# Patient Record
Sex: Male | Born: 1980 | Race: White | Hispanic: No | Marital: Single | Smoking: Former smoker
Health system: Southern US, Community
[De-identification: ages and names within clinical notes are randomized; demographics above are authoritative.]

## PROBLEM LIST (undated history)

## (undated) ENCOUNTER — Emergency Department: Discharge status: Absent without leave | Payer: Self-pay | Source: Home / Self Care

## (undated) DIAGNOSIS — M545 Low back pain: Secondary | ICD-10-CM

## (undated) DIAGNOSIS — M722 Plantar fascial fibromatosis: Secondary | ICD-10-CM

## (undated) HISTORY — DX: Low back pain: M54.5

## (undated) HISTORY — DX: Plantar fascial fibromatosis: M72.2

---

## 2009-10-08 ENCOUNTER — Ambulatory Visit: Payer: Self-pay | Admitting: Emergency Medicine

## 2010-02-27 NOTE — Assessment & Plan Note (Signed)
Summary: LBP x 2 dys rm 4   Vital Signs:  Patient Profile:   30 Years Old Male CC:      Back pain x 2dys Height:     73.5 inches Weight:      216 pounds O2 Sat:      100 % O2 treatment:    Room Air Temp:     98.6 degrees F oral Pulse rate:   66 / minute Pulse rhythm:   regular Resp:     16 per minute BP sitting:   132 / 75  (left arm) Cuff size:   regular  Vitals Entered By: Areta Haber CMA (October 08, 2009 4:28 PM)                  Current Allergies (reviewed today): ! AMOXICILLIN ! PENICILLIN      History of Present Illness History from: patient Chief Complaint: Back pain x 2dys History of Present Illness: Severe back pain for 2 days.  H/o herniated disc (?L3/4) about 2 years ago tx w/ dose pack, then injections, and time.  This time was going down a slide with a child and stood up and felt the pain.  Stiffness, pain, mild radiation to B legs.  No bladder/bowel dysfx or saddle anesthesia.  No dysuria, hematuria.  Pain is sharp and cramping.  Current Problems: LOWER BACK PAIN (ICD-724.2)   Current Meds IBUPROFEN 200 MG TABS (IBUPROFEN) as directed FLEXERIL 10 MG TABS (CYCLOBENZAPRINE HCL) 1 tab by mouth three times a day as needed for spasms ULTRACET 37.5-325 MG TABS (TRAMADOL-ACETAMINOPHEN) 1 tab by mouth Q6 hours as needed for pain PREDNISONE (PAK) 10 MG TABS (PREDNISONE) use as directed  REVIEW OF SYSTEMS Constitutional Symptoms      Denies fever, chills, night sweats, weight loss, weight gain, and fatigue.  Eyes       Denies change in vision, eye pain, eye discharge, glasses, contact lenses, and eye surgery. Ear/Nose/Throat/Mouth       Denies hearing loss/aids, change in hearing, ear pain, ear discharge, dizziness, frequent runny nose, frequent nose bleeds, sinus problems, sore throat, hoarseness, and tooth pain or bleeding.  Respiratory       Denies dry cough, productive cough, wheezing, shortness of breath, asthma, bronchitis, and emphysema/COPD.   Cardiovascular       Denies murmurs, chest pain, and tires easily with exhertion.    Gastrointestinal       Denies stomach pain, nausea/vomiting, diarrhea, constipation, blood in bowel movements, and indigestion. Genitourniary       Denies painful urination, kidney stones, and loss of urinary control. Neurological       Denies paralysis, seizures, and fainting/blackouts. Musculoskeletal       Complains of muscle pain, joint pain, and decreased range of motion.      Denies joint stiffness, redness, swelling, muscle weakness, and gout.      Comments: LBP x 2 dys Skin       Denies bruising, unusual mles/lumps or sores, and hair/skin or nail changes.  Psych       Denies mood changes, temper/anger issues, anxiety/stress, speech problems, depression, and sleep problems.  Past History:  Past Medical History: Herniated Disc   Social History: Single Never Smoked Alcohol use-no Drug use-no Regular exercise-no Smoking Status:  never Drug Use:  no Does Patient Exercise:  no Physical Exam General appearance: well developed, well nourished, mild distress Head: normocephalic, atraumatic Chest/Lungs: no rales, wheezes, or rhonchi bilateral, breath sounds equal without effort Heart: regular  rate and  rhythm, no murmur Extremities: FROM, full strength and normal sensation Neurological: grossly intact and non-focal Back: No TTP.  Lumbar kyphosis.  SLR+ bilateral.  Skin: no obvious rashes or lesions MSE: oriented to time, place, and person Assessment New Problems: LOWER BACK PAIN (ICD-724.2)   Patient Education: Patient and/or caregiver instructed in the following: rest, fluids. heating pad  Plan New Medications/Changes: PREDNISONE (PAK) 10 MG TABS (PREDNISONE) use as directed  #QS x 0, 10/08/2009, Hoyt Koch MD ULTRACET 37.5-325 MG TABS (TRAMADOL-ACETAMINOPHEN) 1 tab by mouth Q6 hours as needed for pain  #30 x 0, 10/08/2009, Hoyt Koch MD FLEXERIL 10 MG TABS  (CYCLOBENZAPRINE HCL) 1 tab by mouth three times a day as needed for spasms  #21 x 0, 10/08/2009, Hoyt Koch MD  New Orders: New Patient Level II [phone] Planning Comments:   If not improving in about 3 days, call the office and we will refer to sports medicine clinic.  You may need to have a follow up MRI or other imaging due to your history of previous herniated disc.    The patient and/or caregiver has been counseled thoroughly with regard to medications prescribed including dosage, schedule, interactions, rationale for use, and possible side effects and they verbalize understanding.  Diagnoses and expected course of recovery discussed and will return if not improved as expected or if the condition worsens. Patient and/or caregiver verbalized understanding.  Prescriptions: PREDNISONE (PAK) 10 MG TABS (PREDNISONE) use as directed  #QS x 0   Entered and Authorized by:   Hoyt Koch MD   Signed by:   Hoyt Koch MD on 10/08/2009   Method used:   Print then Give to Patient   RxID:   [id number] ULTRACET 37.5-325 MG TABS (TRAMADOL-ACETAMINOPHEN) 1 tab by mouth Q6 hours as needed for pain  #30 x 0   Entered and Authorized by:   Hoyt Koch MD   Signed by:   Hoyt Koch MD on 10/08/2009   Method used:   Print then Give to Patient   RxID:   [id number] FLEXERIL 10 MG TABS (CYCLOBENZAPRINE HCL) 1 tab by mouth three times a day as needed for spasms  #21 x 0   Entered and Authorized by:   Hoyt Koch MD   Signed by:   Hoyt Koch MD on 10/08/2009   Method used:   Print then Give to Patient   RxID:   [id number]   Orders Added: 1)  New Patient Level II [40347]  Appended Document: LBP x 2 dys rm 4 F/U call to pt - per recording, number provided is incorrect or disconnected.

## 2012-01-25 ENCOUNTER — Emergency Department
Admission: EM | Admit: 2012-01-25 | Discharge: 2012-01-25 | Disposition: A | Discharge status: Home / Self Care | Payer: Self-pay | Source: Home / Self Care | Attending: Family Medicine | Admitting: Family Medicine

## 2012-01-25 DIAGNOSIS — M545 Low back pain: Secondary | ICD-10-CM

## 2012-01-25 MED ORDER — HYDROCODONE-ACETAMINOPHEN 5-325 MG PO TABS: ORAL_TABLET | ORAL | Status: DC

## 2012-01-25 MED ORDER — METHYLPREDNISOLONE SODIUM SUCC 125 MG IJ SOLR
125.0000 mg | Freq: Once | INTRAMUSCULAR | Status: AC
  Administered 2012-01-25: 125 mg via INTRAMUSCULAR

## 2012-01-25 MED ORDER — PREDNISONE 10 MG PO TABS: ORAL_TABLET | ORAL | Status: DC

## 2012-01-25 MED ORDER — KETOROLAC TROMETHAMINE 60 MG/2ML IM SOLN
60.0000 mg | Freq: Once | INTRAMUSCULAR | Status: AC
  Administered 2012-01-25: 60 mg via INTRAMUSCULAR

## 2012-01-25 NOTE — ED Notes (Signed)
Herniated disc L4/S2, states walking shopping on Thursday and noted with increase back pain on Friday.

## 2012-01-25 NOTE — ED Provider Notes (Signed)
History     CSN: [id number]  Arrival date & time 01/25/12  1728   First MD Initiated Contact with Patient 01/25/12 1834      Chief Complaint  Patient presents with  . Back Pain     HPI Comments: Patient states that he had been shopping 2 days ago.  Yesterday he awoke with recurrent low back pain that occasionally radiates to posterior legs.  The pain is worse when walking and standing.  No bowel or bladder dysfunction.  No saddle numbness.  He states that he has a past history of herniated disc  Patient is a 31 y.o. male presenting with back pain. The history is provided by the patient.  Back Pain  This is a recurrent problem. The current episode started yesterday. The problem occurs constantly. The problem has not changed since onset.Associated with: walking. The pain is present in the lumbar spine and gluteal region. The quality of the pain is described as stabbing. The pain radiates to the left thigh and right thigh. The pain is moderate. Exacerbated by: walking and standing. The pain is the same all the time. Associated symptoms include paresis. Pertinent negatives include no chest pain, no fever, no numbness, no weight loss, no headaches, no abdominal pain, no abdominal swelling, no bowel incontinence, no perianal numbness, no bladder incontinence, no dysuria, no pelvic pain, no leg pain, no paresthesias, no tingling and no weakness.    History reviewed. No pertinent past medical history.  History reviewed. No pertinent past surgical history.  Family History  Problem Relation Age of Onset  . Hypertension Mother   . Diabetes Mother   . Hypertension Father   . Diabetes Father     History  Substance Use Topics  . Smoking status: Not on file  . Smokeless tobacco: Not on file  . Alcohol Use:       Review of Systems  Constitutional: Negative for fever and weight loss.  Cardiovascular: Negative for chest pain.  Gastrointestinal: Negative for abdominal pain and bowel  incontinence.  Genitourinary: Negative for bladder incontinence, dysuria and pelvic pain.  Musculoskeletal: Positive for back pain.  Neurological: Negative for tingling, weakness, numbness, headaches and paresthesias.  All other systems reviewed and are negative.    Allergies  Amoxicillin and Penicillins  Home Medications   Current Outpatient Rx  Name  Route  Sig  Dispense  Refill  . HYDROCODONE-ACETAMINOPHEN 5-325 MG PO TABS      Take one by mouth at bedtime as needed for pain   10 tablet   0   . PREDNISONE 10 MG PO TABS      Take 2 tabs by mouth twice daily for three days, then one tab twice daily for 2 days, then 1 tab daily for two days.  Take PC.  Begin Sunday 01/26/12   18 tablet   0     BP 126/80  Pulse 104  Temp 98.2 F (36.8 C) (Oral)  Resp 20  Ht 6' 1.5" (1.867 m)  Wt 229 lb (103.874 kg)  BMI 29.80 kg/m2  SpO2 96%  Physical Exam Nursing notes and Vital Signs reviewed. Appearance:  Patient appears healthy, stated age, and in no acute distress but moves slowly and deliberately onto exam table Eyes:  Pupils are equal, round, and reactive to light and accomodation.  Extraocular movement is intact.  Conjunctivae are not inflamed  Pharynx:  Normal Neck:  Supple.  No adenopathy  Lungs:  Clear to auscultation.  Breath sounds are equal.  Heart:  Regular rate and rhythm without murmurs, rubs, or gallops.  Abdomen:  Nontender without masses or hepatosplenomegaly.  Bowel sounds are present.  No CVA or flank tenderness.  Extremities:  No edema.  No calf tenderness Skin:  No rash present.  Back:  Decreased range of motion.  Tenderness in the midline and bilateral paraspinous muscles from L4 to Sacral area.  Unable to perform straight leg raise testing (patient has difficulty achieving supine position).  Sitting knee extension test is negative.  Strength and sensation in the lower extremities is normal.  Patellar and achilles reflexes are normal   ED Course  Procedures   none      1. Acute low back pain       MDM  Toradol 60mg  IM Solumedrol 125mg .  Tomorrow begin prednisone taper.  Lortab for pain at night. Apply ice pack to lower back two or three times daily. Followup with orthopedist in one week.        Lattie Haw, MD 01/28/12 2013

## 2012-02-07 ENCOUNTER — Ambulatory Visit (INDEPENDENT_AMBULATORY_CARE_PROVIDER_SITE_OTHER): Payer: Self-pay

## 2012-02-07 ENCOUNTER — Ambulatory Visit (INDEPENDENT_AMBULATORY_CARE_PROVIDER_SITE_OTHER): Payer: Self-pay | Admitting: Sports Medicine

## 2012-02-07 VITALS — BP 116/83 | HR 112 | Wt 227.0 lb

## 2012-02-07 DIAGNOSIS — M47816 Spondylosis without myelopathy or radiculopathy, lumbar region: Secondary | ICD-10-CM | POA: Insufficient documentation

## 2012-02-07 DIAGNOSIS — M545 Low back pain, unspecified: Secondary | ICD-10-CM

## 2012-02-07 DIAGNOSIS — M5137 Other intervertebral disc degeneration, lumbosacral region: Secondary | ICD-10-CM

## 2012-02-07 HISTORY — DX: Low back pain, unspecified: M54.50

## 2012-02-07 MED ORDER — GABAPENTIN 300 MG PO CAPS: ORAL_CAPSULE | ORAL | Status: DC

## 2012-02-07 MED ORDER — MELOXICAM 15 MG PO TABS: ORAL_TABLET | ORAL | Status: DC

## 2012-02-07 NOTE — Progress Notes (Signed)
SPORTS MEDICINE CONSULTATION REPORT  Subjective:    Primary care provider: [facility].  CC: Low back pain  HPI: Jason Brandt is a very pleasant 32 year old male who comes in with an unfortunate history of pain he localizes in the midline of his lower lumbar spine for several decades. The pain is been on and off. He recalls having injections done under fluoroscopy in the distant past, for what sounds like degenerative disc disease, these were effective, the pain he has now is different. It is localized but does radiate down into his right buttock, is worse when getting up from sitting, and worse with extension. Is not worse with Valsalva. He describes it as if a spear was stabbed into his back. He denies any constitutional symptoms, and denies any bowel or bladder dysfunction.  Past medical history, Surgical history, Family history, Social history, Allergies, and medications have been entered into the medical record, reviewed, and no changes needed.   Review of Systems: No headache, visual changes, nausea, vomiting, diarrhea, constipation, dizziness, abdominal pain, skin rash, fevers, chills, night sweats, weight loss, swollen lymph nodes, body aches, joint swelling, muscle aches, chest pain, shortness of breath, mood changes, visual or auditory hallucinations.   Objective:   Vitals:  Afebrile, vital signs stable. General: Well Developed, well nourished, and in no acute distress.  Neuro/Psych: Alert and oriented x3, extra-ocular muscles intact, able to move all 4 extremities, sensation grossly intact. Skin: Warm and dry, no rashes noted.  Respiratory: Not using accessory muscles, speaking in full sentences, trachea midline.  Cardiovascular: Pulses palpable, no extremity edema. Abdomen: Does not appear distended. Back Exam:  Inspection: Unremarkable  Motion: Flexion 45 deg, Extension 45 deg, Side Bending to 45 deg bilaterally,  Rotation to 45 deg bilaterally  SLR laying: Reproduces pain in the  back but not radicular pain.  XSLR laying: Negative  Palpable tenderness: None. FABER: negative. Sensory change: Gross sensation intact to all lumbar and sacral dermatomes.  Reflexes: 2+ at both patellar tendons, 2+ at achilles tendons, Babinski's downgoing.  Strength at foot  Plantar-flexion: 5/5 Dorsi-flexion: 5/5 Eversion: 5/5 Inversion: 5/5  Leg strength  Quad: 5/5 Hamstring: 5/5 Hip flexor: 5/5 Hip abductors: 5/5  Gait unremarkable.  Impression and Recommendations:   This case required medical decision making of moderate complexity.

## 2012-02-07 NOTE — Assessment & Plan Note (Signed)
Axial with radiation into right buttock. Symptoms are worse with extension which suggests facet arthrosis. He has had epidural injections in the past but this pain is different. I would like to obtain an x-ray, as well as an MRI of his lumbar spine for interventional injection planning. I'm also going to start Mobic and gabapentin and have him do some home rehabilitation exercises. I will see him back for results of the MRI. Of note, he would like the MRI scheduled after February 1.

## 2012-02-07 NOTE — Patient Instructions (Signed)
SPORTS MEDICINE CONSULTATION REPORT  Subjective:    Primary care physician: [facility].  CC: Back pain.  HPI: Jason Brandt is a very pleasant 32 year old male who comes in with a decade history of on and off pain he localizes in the right side of his lower lumbar spine. He recalls injuring his back doing a power lift as a kid, since then his pain has been localized, but does radiate down into the right buttock. It is worse when getting up from sitting, as well as with extension. He denies any worsening of pain with Valsalva, denies any constitutional symptoms, and denies any bowel or bladder dysfunction. He has been seen in the past, has had MRIs that show what sound to be L4-L5 degenerative disc disease, and it sounds as though he's had lumbar epidural injections which were only moderately effective. He notes that his pain today is significantly different and he describes it as if a spear was stabbed into his back.  Past medical history, Surgical history, Family history, Social history, Allergies, and medications have been entered into the medical record, reviewed, and no changes needed.   Review of Systems: No headache, visual changes, nausea, vomiting, diarrhea, constipation, dizziness, abdominal pain, skin rash, fevers, chills, night sweats, weight loss, swollen lymph nodes, body aches, joint swelling, muscle aches, chest pain, shortness of breath, mood changes, visual or auditory hallucinations.   Objective:   Vitals:  Afebrile, vital signs stable. General: Well Developed, well nourished, and in no acute distress.  Neuro/Psych: Alert and oriented x3, extra-ocular muscles intact, able to move all 4 extremities, sensation grossly intact. Skin: Warm and dry, no rashes noted.  Respiratory: Not using accessory muscles, speaking in full sentences, trachea midline.  Cardiovascular: Pulses palpable, no extremity edema. Abdomen: Does not appear distended. Back Exam:  Inspection: Unremarkable    Motion: Flexion 45 deg, Extension 45 deg, Side Bending to 45 deg bilaterally,  Rotation to 45 deg bilaterally  SLR laying: Positive with reproduction of pain in back, but no radicular symptoms.  XSLR laying: Negative  Palpable tenderness: None. FABER: negative. Sensory change: Gross sensation intact to all lumbar and sacral dermatomes.  Reflexes: 2+ at both patellar tendons, 2+ at achilles tendons, Babinski's downgoing.  Strength at foot  Plantar-flexion: 5/5 Dorsi-flexion: 5/5 Eversion: 5/5 Inversion: 5/5  Leg strength  Quad: 5/5 Hamstring: 5/5 Hip flexor: 5/5 Hip abductors: 5/5  Gait unremarkable.  Impression and Recommendations:   This case required medical decision making of moderate complexity.

## 2012-02-29 ENCOUNTER — Ambulatory Visit (HOSPITAL_BASED_OUTPATIENT_CLINIC_OR_DEPARTMENT_OTHER): Payer: Self-pay

## 2012-03-05 ENCOUNTER — Telehealth: Payer: Self-pay | Admitting: *Deleted

## 2012-03-05 NOTE — Telephone Encounter (Signed)
Prior auth obtained for MRI Lumbar spine w/o contrast. Auth # [id number]

## 2012-03-06 ENCOUNTER — Ambulatory Visit (INDEPENDENT_AMBULATORY_CARE_PROVIDER_SITE_OTHER): Payer: BC Managed Care – PPO | Admitting: Family Medicine

## 2012-03-06 ENCOUNTER — Encounter: Payer: Self-pay | Admitting: Family Medicine

## 2012-03-06 VITALS — BP 128/88 | HR 77 | Ht 73.5 in | Wt 229.0 lb

## 2012-03-06 DIAGNOSIS — M722 Plantar fascial fibromatosis: Secondary | ICD-10-CM | POA: Insufficient documentation

## 2012-03-06 DIAGNOSIS — B354 Tinea corporis: Secondary | ICD-10-CM

## 2012-03-06 DIAGNOSIS — L739 Follicular disorder, unspecified: Secondary | ICD-10-CM | POA: Insufficient documentation

## 2012-03-06 DIAGNOSIS — M545 Low back pain, unspecified: Secondary | ICD-10-CM

## 2012-03-06 DIAGNOSIS — L738 Other specified follicular disorders: Secondary | ICD-10-CM

## 2012-03-06 HISTORY — DX: Plantar fascial fibromatosis: M72.2

## 2012-03-06 MED ORDER — MELOXICAM 15 MG PO TABS: ORAL_TABLET | ORAL | Status: DC

## 2012-03-06 MED ORDER — CLOTRIMAZOLE 1 % EX CREA: TOPICAL_CREAM | CUTANEOUS | Status: AC

## 2012-03-06 MED ORDER — SULFAMETHOXAZOLE-TRIMETHOPRIM 800-160 MG PO TABS: ORAL_TABLET | ORAL | Status: AC

## 2012-03-06 NOTE — Progress Notes (Signed)
CC: Jason Brandt is a 32 y.o. male is here for Establish Care   Subjective: HPI:  Patient presents to establish care  Acute complaint of rash on the back. It has been present for 2-3 weeks. Seems more apparent this last week. Worse the more he lies down on his back, improves with less time spent lying on back. Associated with itching which is described as moderate in severity but no other pain. He's never had this before. It is present all hours of the day. No interventions as of yet. It is localized only on his back without any skin changes elsewhere. Nothing other than above makes it better or worse.   Complains of chronic back pain which is currently being managed by Dr. Karie Schwalbe. He is about to run out of meloxicam and is requesting refill. He describes his back pain is severe but meloxicam has brought up to moderate and tolerable levels. Describes this as a soreness just above his right buttock that radiates down to the right knee. Worse when standing up quickly, sitting for more than 15 minutes, or lying down for more than 3 hours. Character and severity of pain has not changed since she saw Dr. Karie Schwalbe. last. He has a MRI scheduled for tomorrow. Denies weakness of the extremities, saddle paresthesia, bowel or bladder incontinence.   Review of Systems - General ROS: negative for - chills, fever, night sweats, weight gain or weight loss Ophthalmic ROS: negative for - decreased vision Psychological ROS: negative for - anxiety . Positive for subjective depression ENT ROS: negative for - hearing change, nasal congestion, tinnitus or allergies Hematological and Lymphatic ROS: negative for - bleeding problems, bruising or swollen lymph nodes Breast ROS: negative Respiratory ROS: no cough, shortness of breath, or wheezing Cardiovascular ROS: no chest pain or dyspnea on exertion Gastrointestinal ROS: no abdominal pain, change in bowel habits, or black or bloody stools Genito-Urinary ROS: negative for -  genital discharge, genital ulcers, incontinence or abnormal bleeding from genitals Neurological ROS: negative for - headaches or memory loss Dermatological ROS: negative for lumps, mole changes, rash and skin lesion changes other than that described above  Past Medical History  Diagnosis Date  . Low back pain 02/07/2012  . Plantar fasciitis 03/06/2012     Family History  Problem Relation Age of Onset  . Hypertension Mother   . Diabetes Mother   . Hypertension Father   . Diabetes Father      History  Substance Use Topics  . Smoking status: Former Games developer  . Smokeless tobacco: Not on file  . Alcohol Use: Not on file     Objective: Filed Vitals:   03/06/12 1105  BP: 128/88  Pulse: 77    General: Alert and Oriented, No Acute Distress HEENT: Pupils equal, round, reactive to light. Conjunctivae clear.  External ears unremarkable, moist mucous membranes Lungs: Clear comfortable work of breathing Cardiac: Regular rate and rhythm. Abdomen: Soft and nontender Extremities: No peripheral edema.  Strong peripheral pulses.  Mental Status: No depression, anxiety, nor agitation. Skin: Warm and dry. There is a quarter-sized circular lesion on the left flank with central clearing in the periphery of hyperkeratotic skin with erythema. There are diffuse pustules scattered around his back  Assessment & Plan: Ray[place] was seen today for establish care.  Diagnoses and associated orders for this visit:  Folliculitis - sulfamethoxazole-trimethoprim (SEPTRA DS) 800-160 MG per tablet; One by mouth twice a day for ten days.  Ringworm of body - clotrimazole (LOTRIMIN) 1 %  cream; Apply to affected areas twice a day for up to four weeks, applying up to two weeks after resolution of symptoms.  Low back pain - meloxicam (MOBIC) 15 MG tablet; One tab PO qAM with breakfast for 2 weeks, then daily prn pain.    Folliculitis: Penicillin allergy therefore start Septra, discussed keeping back clean and dry  to avoid reoccurrence. Ringworm: Start clotrimazole Low back pain: Refill meloxicam, keep followup with Dr. Karie Schwalbe. following tomorrow's MRI.  30 minutes spent face-to-face during visit today of which at least 50% was counseling or coordinating care regarding low back pain, ringworm, folliculitis.   Return in about 4 weeks (around 04/03/2012).

## 2012-03-07 ENCOUNTER — Ambulatory Visit (HOSPITAL_BASED_OUTPATIENT_CLINIC_OR_DEPARTMENT_OTHER)
Admission: RE | Admit: 2012-03-07 | Discharge: 2012-03-07 | Disposition: A | Discharge status: Home / Self Care | Payer: BC Managed Care – PPO | Source: Ambulatory Visit | Attending: Sports Medicine | Admitting: Sports Medicine

## 2012-03-07 DIAGNOSIS — M5137 Other intervertebral disc degeneration, lumbosacral region: Secondary | ICD-10-CM | POA: Insufficient documentation

## 2012-03-07 DIAGNOSIS — M51379 Other intervertebral disc degeneration, lumbosacral region without mention of lumbar back pain or lower extremity pain: Secondary | ICD-10-CM | POA: Insufficient documentation

## 2012-03-07 DIAGNOSIS — M5126 Other intervertebral disc displacement, lumbar region: Secondary | ICD-10-CM | POA: Insufficient documentation

## 2012-03-07 DIAGNOSIS — M545 Low back pain, unspecified: Secondary | ICD-10-CM | POA: Insufficient documentation

## 2012-03-10 ENCOUNTER — Other Ambulatory Visit: Payer: Self-pay

## 2012-03-10 ENCOUNTER — Ambulatory Visit (INDEPENDENT_AMBULATORY_CARE_PROVIDER_SITE_OTHER): Payer: BC Managed Care – PPO | Admitting: Sports Medicine

## 2012-03-10 DIAGNOSIS — M545 Low back pain, unspecified: Secondary | ICD-10-CM

## 2012-03-10 MED ORDER — HYDROCODONE-ACETAMINOPHEN 10-325 MG PO TABS: 1.0000 | ORAL_TABLET | Freq: Three times a day (TID) | ORAL | Status: DC | PRN

## 2012-03-10 NOTE — Progress Notes (Signed)
  Subjective:    CC: Follow  HPI: Jason Brandt comes back in to followup his low back pain. He has known degenerative disc disease and has had a right-sided L5-S1 transforaminal injection sometime ago that worked very well. He has already been through physical therapy, steroids, muscle relaxers, NSAIDs, and is currently taking gabapentin. He does have improvement with gabapentin meloxicam, but unfortunately does still have very severe pain. He comes back after her MRI to go over results. Pain is worse with flexion, but also present with extension, radiates down into his right buttock, but not past the knee. Back pain is worse than leg pain. Pain is worse with Valsalva and twisting maneuvers.  Past medical history, Surgical history, Family history not pertinant except as noted below, Social history, Allergies, and medications have been entered into the medical record, reviewed, and no changes needed.   Review of Systems: No headache, visual changes, nausea, vomiting, diarrhea, constipation, dizziness, abdominal pain, skin rash, fevers, chills, night sweats, weight loss, swollen lymph nodes, body aches, joint swelling, muscle aches, chest pain, shortness of breath, mood changes, visual or auditory hallucinations.   Objective:   General: Well Developed, well nourished, and in no acute distress.  Neuro/Psych: Alert and oriented x3, extra-ocular muscles intact, able to move all 4 extremities, sensation grossly intact. Skin: Warm and dry, no rashes noted.  Respiratory: Not using accessory muscles, speaking in full sentences, trachea midline.  Cardiovascular: Pulses palpable, no extremity edema. Abdomen: Does not appear distended.  I reviewed the MRI myself, there is severe degenerative disc disease with disc extrusion at the L5-S1 level with bilateral lateral recess and foraminal stenosis. The facets look overall okay.  Impression and Recommendations:   This case required medical decision making of moderate  complexity.

## 2012-03-10 NOTE — Assessment & Plan Note (Addendum)
MRI does not show much in terms of facet arthrosis, at the L5-S1 disc is severely degenerated with bilateral lateral recess and foraminal stenosis. I'm going to set him up for a right-sided transforaminal epidural injection.  I would like to see him back after the injection. I'm giving him a small amount of hydrocodone, he understands this is not going to be regular thing.

## 2012-05-14 ENCOUNTER — Telehealth: Payer: Self-pay | Admitting: Sports Medicine

## 2012-05-14 DIAGNOSIS — M545 Low back pain, unspecified: Secondary | ICD-10-CM

## 2012-05-14 MED ORDER — MELOXICAM 15 MG PO TABS: ORAL_TABLET | ORAL | Status: DC

## 2012-05-14 MED ORDER — GABAPENTIN 300 MG PO CAPS: 300.0000 mg | ORAL_CAPSULE | Freq: Three times a day (TID) | ORAL | Status: AC

## 2012-05-14 NOTE — Telephone Encounter (Signed)
Patient called request to know if he can have a refill for neurontin (Generic) but 1/2 the dosage that was given last time because he cant afford it, Hydrocodone and meloxicam called into [facility] in [place] today since we are closed tomorrow. Thanks

## 2012-05-14 NOTE — Telephone Encounter (Signed)
This was already discussed with Meyer Cory, and we'll refill meloxicam and gabapentin, but not the hydrocodone.

## 2012-05-18 ENCOUNTER — Encounter: Payer: Self-pay | Admitting: Family Medicine

## 2012-05-18 ENCOUNTER — Ambulatory Visit (INDEPENDENT_AMBULATORY_CARE_PROVIDER_SITE_OTHER): Payer: BC Managed Care – PPO | Admitting: Family Medicine

## 2012-05-18 VITALS — BP 143/85 | HR 92 | Temp 98.4°F | Wt 229.0 lb

## 2012-05-18 DIAGNOSIS — Z13220 Encounter for screening for lipoid disorders: Secondary | ICD-10-CM

## 2012-05-18 DIAGNOSIS — B3749 Other urogenital candidiasis: Secondary | ICD-10-CM

## 2012-05-18 DIAGNOSIS — K047 Periapical abscess without sinus: Secondary | ICD-10-CM

## 2012-05-18 DIAGNOSIS — Z131 Encounter for screening for diabetes mellitus: Secondary | ICD-10-CM

## 2012-05-18 MED ORDER — FLUCONAZOLE 150 MG PO TABS: ORAL_TABLET | ORAL | Status: AC

## 2012-05-18 MED ORDER — DOXYCYCLINE HYCLATE 100 MG PO TABS: ORAL_TABLET | ORAL | Status: AC

## 2012-05-18 NOTE — Progress Notes (Signed)
CC: Jason Brandt is a 32 y.o. male is here for possible tooth abscess and Rash   Subjective: HPI:  Patient complains of left jaw pain. Localized to the posterior-most tooth on the left lower jaw. Pain is described as moderate in severity and sharp. Worse with chewing, improves with rest. Pain is nonradiating. Pain came on suddenly after a portion of this tooth was broken off while eating. Pain has not gotten better or worse overall since onset.  He is saving his money for tooth extraction with his dentist. He reports subjective fevers and chills for the past 24 hours. He denies nausea, confusion, swollen lymph nodes, trouble swallowing, difficulty breathing, shortness of breath.  Patient complains of itching and redness at the tip of his penis. He has been present for about a week. He has been keeping it clean with soap and water but symptoms have not gotten better or worse. Symptoms are mild in severity. He denies dysuria, penile discharge, testicular pain, testicular swelling, nor lymph node swelling of the groin.  He was unable to get former lipid panel or diabetic screening discussed and is been well over a year since this was checked last    Review Of Systems Outlined In HPI  Past Medical History  Diagnosis Date  . Low back pain 02/07/2012  . Plantar fasciitis 03/06/2012     Family History  Problem Relation Age of Onset  . Hypertension Mother   . Diabetes Mother   . Hypertension Father   . Diabetes Father      History  Substance Use Topics  . Smoking status: Former Games developer  . Smokeless tobacco: Not on file  . Alcohol Use: Not on file     Objective: Filed Vitals:   05/18/12 0954  BP: 143/85  Pulse: 92  Temp: 98.4 F (36.9 C)    General: Alert and Oriented, No Acute Distress HEENT: Pupils equal, round, reactive to light. Conjunctivae clear.   Moist mucous membranes, pharynx without inflammation nor lesions.  Neck supple without palpable lymphadenopathy nor abnormal  masses. Poor molar dentition throughout the mouth, left inferior posterior most molar with cavities and missing medial aspect with mild surrounding erythema and of the gum but no discharge Lungs: Clear to auscultation bilaterally, no wheezing/ronchi/rales.  Comfortable work of breathing. Good air movement. Genitourinary: Circumcised penis with mild erythema of the dorsal aspect of the glans. Extremities: No peripheral edema.  Strong peripheral pulses.  Mental Status: No depression, anxiety, nor agitation. Skin: Warm and dry.  Assessment & Plan: Jason Brandt was seen today for possible tooth abscess and rash.  Diagnoses and associated orders for this visit:  Yeast dermatitis of penis - fluconazole (DIFLUCAN) 150 MG tablet; Take one tab, may take second tab if no improvement after 72 hours. - CBC  Dental abscess - doxycycline (VIBRA-TABS) 100 MG tablet; One by mouth twice a day for ten days.  Screening for diabetes mellitus - BASIC METABOLIC PANEL WITH GFR  Screening, lipid - Lipid panel    Yeast dermatitis: Start fluconazole above. Dental abscess: Start doxycycline and followup with dentist as soon as possible.Signs and symptoms requring emergent/urgent reevaluation were discussed with the patient. We'll screen for type 2 diabetes and dyslipidemia with the above labs fasting in the near future  Return if symptoms worsen or fail to improve.

## 2012-05-18 NOTE — Telephone Encounter (Signed)
Pt was informed on Friday. He also stated he had refills on the Meloxicam and Gabapentin.

## 2012-05-19 ENCOUNTER — Ambulatory Visit
Admission: RE | Admit: 2012-05-19 | Discharge: 2012-05-19 | Disposition: A | Discharge status: Home / Self Care | Payer: BC Managed Care – PPO | Source: Ambulatory Visit | Attending: Sports Medicine | Admitting: Sports Medicine

## 2012-05-19 VITALS — BP 142/98 | HR 72

## 2012-05-19 DIAGNOSIS — M545 Low back pain: Secondary | ICD-10-CM

## 2012-05-19 MED ORDER — IOHEXOL 180 MG/ML  SOLN
1.0000 mL | Freq: Once | INTRAMUSCULAR | Status: AC | PRN
  Administered 2012-05-19: 1 mL via EPIDURAL

## 2012-05-19 MED ORDER — METHYLPREDNISOLONE ACETATE 40 MG/ML INJ SUSP (RADIOLOG
120.0000 mg | Freq: Once | INTRAMUSCULAR | Status: AC
  Administered 2012-05-19: 120 mg via EPIDURAL

## 2012-06-12 ENCOUNTER — Ambulatory Visit (INDEPENDENT_AMBULATORY_CARE_PROVIDER_SITE_OTHER): Payer: BC Managed Care – PPO | Admitting: Family Medicine

## 2012-06-12 ENCOUNTER — Encounter: Payer: Self-pay | Admitting: Family Medicine

## 2012-06-12 VITALS — BP 128/83 | HR 81 | Wt 226.0 lb

## 2012-06-12 DIAGNOSIS — M109 Gout, unspecified: Secondary | ICD-10-CM | POA: Insufficient documentation

## 2012-06-12 DIAGNOSIS — M545 Low back pain: Secondary | ICD-10-CM

## 2012-06-12 DIAGNOSIS — R7309 Other abnormal glucose: Secondary | ICD-10-CM

## 2012-06-12 DIAGNOSIS — B3749 Other urogenital candidiasis: Secondary | ICD-10-CM | POA: Insufficient documentation

## 2012-06-12 DIAGNOSIS — Z8739 Personal history of other diseases of the musculoskeletal system and connective tissue: Secondary | ICD-10-CM

## 2012-06-12 DIAGNOSIS — R739 Hyperglycemia, unspecified: Secondary | ICD-10-CM

## 2012-06-12 MED ORDER — FLUCONAZOLE 150 MG PO TABS: ORAL_TABLET | ORAL | Status: AC

## 2012-06-12 NOTE — Progress Notes (Signed)
CC: Jason Brandt is a 32 y.o. male is here for f/u back pain   Subjective: HPI:  Patient presents for [facility] paperwork for chronic back pain with right lumbar radiculopathy. Noticing pain significantly improves if he has 2 or more days off work. Pain significantly worsens after 15-20 minutes of stationary sitting, walking, or standing. 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 Strong family history of diabetes type 2  Tells me he was once diagnosed with gout. Reports occasional pain in right great toe without redness or swelling. No current discomfort  Reports redness on tip of penis significantly improved with Diflucan however returned after antibiotics for dental procedure  Review Of Systems Outlined In HPI  Past Medical History  Diagnosis Date  . Low back pain 02/07/2012  . Plantar fasciitis 03/06/2012     Family History  Problem Relation Age of Onset  . Hypertension Mother   . Diabetes Mother   . Hypertension Father   . Diabetes Father      History  Substance Use Topics  . Smoking status: Former Games developer  . Smokeless tobacco: Not on file  . Alcohol Use: Not on file     Objective: Filed Vitals:   06/12/12 1549  BP: 128/83  Pulse: 81    Vital signs reviewed. General: Alert and Oriented, No Acute Distress HEENT: Pupils equal, round, reactive to light. Conjunctivae clear.  External ears unremarkable.  Moist mucous membranes. Lungs: Clear and comfortable work of breathing, speaking in full sentences without accessory muscle use. Cardiac: Regular rate and rhythm.  Neuro: CN II-XII grossly intact, gait normal. Extremities: No peripheral edema.  Strong peripheral pulses.  Mental Status: No depression, anxiety, nor agitation. Logical though process. Skin: Warm and dry.  Assessment & Plan: Jason Brandt was seen today for f/u back pain.  Diagnoses and associated orders for this visit:  Low  back pain  Yeast dermatitis of penis - fluconazole (DIFLUCAN) 150 MG tablet; Take one tab, may take second tab if no improvement after 72 hours.  Hyperglycemia - Hemoglobin A1c  History of gout - Uric acid    History of gout: Checking uric acid with goal less than 6 Hyperglycemia: Checking A1c for evaluation of prediabetes/type 2 diabetes Yeast dermatitis of penis restart Diflucan now off antibiotics Low back pain: Worsened, FMLA paperwork filled out for one month off for relative rest and focusing on home exercise PT  25 minutes spent face-to-face during visit today of which at least 50% was counseling or coordinating care regarding chronic back pain, yeast dermatitis of penis, hyperglycemia, history of gout   Return in about 4 weeks (around 07/10/2012).

## 2012-08-04 ENCOUNTER — Encounter: Payer: Self-pay | Admitting: Family Medicine

## 2012-08-04 ENCOUNTER — Ambulatory Visit (INDEPENDENT_AMBULATORY_CARE_PROVIDER_SITE_OTHER): Payer: BC Managed Care – PPO | Admitting: Family Medicine

## 2012-08-04 VITALS — BP 134/68 | HR 74 | Wt 229.0 lb

## 2012-08-04 DIAGNOSIS — Z8739 Personal history of other diseases of the musculoskeletal system and connective tissue: Secondary | ICD-10-CM

## 2012-08-04 DIAGNOSIS — M109 Gout, unspecified: Secondary | ICD-10-CM

## 2012-08-04 DIAGNOSIS — Z862 Personal history of diseases of the blood and blood-forming organs and certain disorders involving the immune mechanism: Secondary | ICD-10-CM

## 2012-08-04 DIAGNOSIS — Z8639 Personal history of other endocrine, nutritional and metabolic disease: Secondary | ICD-10-CM

## 2012-08-04 DIAGNOSIS — D72829 Elevated white blood cell count, unspecified: Secondary | ICD-10-CM

## 2012-08-04 MED ORDER — INDOMETHACIN 50 MG PO CAPS: ORAL_CAPSULE | ORAL | Status: AC

## 2012-08-04 NOTE — Progress Notes (Signed)
CC: Jason Brandt is a 32 y.o. male is here for rt toe pain   Subjective: HPI:  Patient returns for followup of right great toe pain after being seen in urgent care Center Sunday. Pain is described as initially severe now moderate that came on abruptly Saturday morning worse with any movement or light touch, had warmth and redness that has also lessened since onset. Pain is described only as a pain that is nonradiating localized at the base of the right great toe greatly improved with ibuprofen 800 mg 3 times a day. He denies recent or remote trauma. He reports he had an x-ray done which was reportedly normal and a uric acid level that appears to have been 8.8. He tells me a similar episode occur in 2011 but since has not had any new distal joint pain swelling nor redness. He does admit to increased protein diet over the past month ingesting somewhere around 180 g of protein a day mostly whey protein. Denies increased alcohol use, any shellfish, but he has been physically active with weight training since I saw him last. Recently and currently he denies any fevers, chills, rapid heartbeat, nausea, skin changes other than above, joint pain other than above, nor night sweats.  Patient has numerous questions regarding a low lymphocyte percentage from [place] blood work on the first. Additionally he was told granulocytes elevated percentagewise when he was seen at urgent care on Sunday. He is under the impression that he has a infection circulating in his body based on these results.  Review Of Systems Outlined In HPI  Past Medical History  Diagnosis Date  . Low back pain 02/07/2012  . Plantar fasciitis 03/06/2012     Family History  Problem Relation Age of Onset  . Hypertension Mother   . Diabetes Mother   . Hypertension Father   . Diabetes Father      History  Substance Use Topics  . Smoking status: Former Games developer  . Smokeless tobacco: Not on file  . Alcohol Use: Not on file      Objective: Filed Vitals:   08/04/12 1507  BP: 134/68  Pulse: 74    Vital signs reviewed. General: Alert and Oriented, No Acute Distress HEENT: Pupils equal, round, reactive to light. Conjunctivae clear.  External ears unremarkable.  Moist mucous membranes. Lungs: Clear and comfortable work of breathing, speaking in full sentences without accessory muscle use. Cardiac: Regular rate and rhythm.  Neuro: CN II-XII grossly intact, gait normal. Extremities:   Strong peripheral pulses. The right foot has moderate swelling at the first MTP that is moderately tender to light touch with moderate redness and mild warmth. Full range of motion strength with good capillary refill time. No pain with palpation of the rest of his foot. Mental Status: No depression, anxiety, nor agitation. Logical though process. Skin: Warm and dry.  Assessment & Plan: Evens was seen today for rt toe pain.  Diagnoses and associated orders for this visit:  History of gout - indomethacin (INDOCIN) 50 MG capsule; Take twice a day only as needed during gout flare.  Leukocytosis, unspecified  Gout    Gout: Discussed with patient that his presentation is highly suggestive of gout, this is likely due to increased protein intake I encouraged him to focus on no more than 1 g per kilogram body weight of protein intake over a day. Specifically 104 g a day but no more to minimize re\re flare.  Discussed dietary interventions otherwise to focus on, handout  was provided. He is not interested in prophylactic regimens. Start indomethacin do not take ibuprofen or meloxicam while using this. Return with any flare and will try to have a strain with Dr. Karie Schwalbe. in sports medicine Leukocytosis: Majority of our visit was spent providing clarification and counseling on recent CBC and differential results. In the setting of gout it would make sense that percentage of granulocytes would be elevated reassurance provided I do not think he has a  circulating infection that he is quite anxious about.  25 minutes spent face-to-face during visit today of which at least 50% was counseling or coordinating care regarding leukocytosis, gout   Return in about 3 months (around 11/04/2012).

## 2012-08-04 NOTE — Patient Instructions (Addendum)
Gout  Gout is an inflammatory condition (arthritis) caused by a buildup of uric acid crystals in the joints. Uric acid is a chemical that is normally present in the blood. Under some circumstances, uric acid can form into crystals in your joints. This causes joint redness, soreness, and swelling (inflammation). Repeat attacks are common. Over time, uric acid crystals can form into masses (tophi) near a joint, causing disfigurement. Gout is treatable and often preventable.  CAUSES   The disease begins with elevated levels of uric acid in the blood. Uric acid is produced by your body when it breaks down a naturally found substance called purines. This also happens when you eat certain foods such as meats and fish. Causes of an elevated uric acid level include:   Being passed down from parent to child (heredity).   Diseases that cause increased uric acid production (obesity, psoriasis, some cancers).   Excessive alcohol use.   Diet, especially diets rich in meat and seafood.   Medicines, including certain cancer-fighting drugs (chemotherapy), diuretics, and aspirin.   Chronic kidney disease. The kidneys are no longer able to remove uric acid well.   Problems with metabolism.  Conditions strongly associated with gout include:   Obesity.   High blood pressure.   High cholesterol.   Diabetes.  Not everyone with elevated uric acid levels gets gout. It is not understood why some people get gout and others do not. Surgery, joint injury, and eating too much of certain foods are some of the factors that can lead to gout.  SYMPTOMS    An attack of gout comes on quickly. It causes intense pain with redness, swelling, and warmth in a joint.   Fever can occur.   Often, only one joint is involved. Certain joints are more commonly involved:   Base of the big toe.   Knee.   Ankle.   Wrist.   Finger.  Without treatment, an attack usually goes away in a few days to weeks. Between attacks, you usually will not have  symptoms, which is different from many other forms of arthritis.  DIAGNOSIS   Your caregiver will suspect gout based on your symptoms and exam. Removal of fluid from the joint (arthrocentesis) is done to check for uric acid crystals. Your caregiver will give you a medicine that numbs the area (local anesthetic) and use a needle to remove joint fluid for exam. Gout is confirmed when uric acid crystals are seen in joint fluid, using a special microscope. Sometimes, blood, urine, and X-ray tests are also used.  TREATMENT   There are 2 phases to gout treatment: treating the sudden onset (acute) attack and preventing attacks (prophylaxis).  Treatment of an Acute Attack   Medicines are used. These include anti-inflammatory medicines or steroid medicines.   An injection of steroid medicine into the affected joint is sometimes necessary.   The painful joint is rested. Movement can worsen the arthritis.   You may use warm or cold treatments on painful joints, depending which works best for you.   Discuss the use of coffee, vitamin C, or cherries with your caregiver. These may be helpful treatment options.  Treatment to Prevent Attacks  After the acute attack subsides, your caregiver may advise prophylactic medicine. These medicines either help your kidneys eliminate uric acid from your body or decrease your uric acid production. You may need to stay on these medicines for a very long time.  The early phase of treatment with prophylactic medicine can be associated   with an increase in acute gout attacks. For this reason, during the first few months of treatment, your caregiver may also advise you to take medicines usually used for acute gout treatment. Be sure you understand your caregiver's directions.  You should also discuss dietary treatment with your caregiver. Certain foods such as meats and fish can increase uric acid levels. Other foods such as dairy can decrease levels. Your caregiver can give you a list of foods  to avoid.  HOME CARE INSTRUCTIONS    Do not take aspirin to relieve pain. This raises uric acid levels.   Only take over-the-counter or prescription medicines for pain, discomfort, or fever as directed by your caregiver.   Rest the joint as much as possible. When in bed, keep sheets and blankets off painful areas.   Keep the affected joint raised (elevated).   Use crutches if the painful joint is in your leg.   Drink enough water and fluids to keep your urine clear or pale yellow. This helps your body get rid of uric acid. Do not drink alcoholic beverages. They slow the passage of uric acid.   Follow your caregiver's dietary instructions. Pay careful attention to the amount of protein you eat. Your daily diet should emphasize fruits, vegetables, whole grains, and fat-free or low-fat milk products.   Maintain a healthy body weight.  SEEK MEDICAL CARE IF:    You have an oral temperature above 102 F (38.9 C).   You develop diarrhea, vomiting, or any side effects from medicines.   You do not feel better in 24 hours, or you are getting worse.  SEEK IMMEDIATE MEDICAL CARE IF:    Your joint becomes suddenly more tender and you have:   Chills.   An oral temperature above 102 F (38.9 C), not controlled by medicine.  MAKE SURE YOU:    Understand these instructions.   Will watch your condition.   Will get help right away if you are not doing well or get worse.  Document Released: 01/12/2000 Document Revised: 04/08/2011 Document Reviewed: 04/24/2009  [employer] Patient Information 2014 [employer]

## 2012-08-10 ENCOUNTER — Ambulatory Visit (INDEPENDENT_AMBULATORY_CARE_PROVIDER_SITE_OTHER): Payer: BC Managed Care – PPO | Admitting: Family Medicine

## 2012-08-10 ENCOUNTER — Ambulatory Visit (INDEPENDENT_AMBULATORY_CARE_PROVIDER_SITE_OTHER): Payer: BC Managed Care – PPO

## 2012-08-10 ENCOUNTER — Encounter: Payer: Self-pay | Admitting: Family Medicine

## 2012-08-10 VITALS — BP 141/86 | HR 72 | Wt 228.0 lb

## 2012-08-10 DIAGNOSIS — M7660 Achilles tendinitis, unspecified leg: Secondary | ICD-10-CM

## 2012-08-10 DIAGNOSIS — M25579 Pain in unspecified ankle and joints of unspecified foot: Secondary | ICD-10-CM

## 2012-08-10 DIAGNOSIS — M545 Low back pain: Secondary | ICD-10-CM

## 2012-08-10 DIAGNOSIS — M7661 Achilles tendinitis, right leg: Secondary | ICD-10-CM

## 2012-08-10 MED ORDER — MELOXICAM 15 MG PO TABS: ORAL_TABLET | ORAL | Status: DC

## 2012-08-10 NOTE — Progress Notes (Signed)
CC: Jason Brandt is a 32 y.o. male is here for right ankle pain   Subjective: HPI:  Patient is a list of right ankle pain that has been present since Sunday this past weekend. Described as moderate burning and pain slightly radiating up into the calf. Symptoms came on abruptly upon awakening overall has not gotten better or worse since. Worse with plantar flexion slightly improved with ibuprofen. He denies recent trauma or overexertion. His right great toe pain is now completely gone after using indomethacin. There has been no swelling or redness or skin changes at the site of his discomfort. He has never had this before.  Pain has been constant since onset. Pain is present all hours of the day. He denies motor or sensory disturbances, weakness, fevers, chills, unilateral leg swelling, history of DVTs.    Review Of Systems Outlined In HPI  Past Medical History  Diagnosis Date  . Low back pain 02/07/2012  . Plantar fasciitis 03/06/2012     Family History  Problem Relation Age of Onset  . Hypertension Mother   . Diabetes Mother   . Hypertension Father   . Diabetes Father      History  Substance Use Topics  . Smoking status: Former Games developer  . Smokeless tobacco: Not on file  . Alcohol Use: Not on file     Objective: Filed Vitals:   08/10/12 1423  BP: 141/86  Pulse: 72    General: Alert and Oriented, No Acute Distress HEENT: Pupils equal, round, reactive to light. Conjunctivae clear.  Moist mucous membranes Lungs: Clear to auscultation bilaterally, no wheezing/ronchi/rales.  Comfortable work of breathing. Good air movement. Cardiac: Regular rate and rhythm. Normal S1/S2.  No murmurs, rubs, nor gallops.   Extremities: No peripheral edema.  Strong peripheral pulses. Left ankle has mild restriction in plantar and dorsiflexion, pain is reproduced with palpation just proximal to the Achilles tendon insertion also reproduced with resisted plantarflexion, Thompson test negative. He's  able to bear weight however with pain   Mental Status: No depression, anxiety, nor agitation. Skin: Warm and dry.  Assessment & Plan: Jason Brandt was seen today for right ankle pain.  Diagnoses and associated orders for this visit:  Right Achilles tendinitis - DG Ankle 2 Views Right; Future  Low back pain - meloxicam (MOBIC) 15 MG tablet; One tab PO qAM with breakfast for 2 weeks, then daily prn pain.    Discussed with patient that discomfort is likely due to tendinitis and/or mild tendon rupture, will rule out avulsion with x-rays above, I like him to start meloxicam daily hold off on ibuprofen. I like him to be immobilized for 2 weeks he has a immobilizing boot at home already and will go home to put it on now.  Return in about 2 weeks (around 08/24/2012).

## 2012-08-27 ENCOUNTER — Encounter: Payer: Self-pay | Admitting: Sports Medicine

## 2012-08-27 ENCOUNTER — Ambulatory Visit (INDEPENDENT_AMBULATORY_CARE_PROVIDER_SITE_OTHER): Payer: BC Managed Care – PPO | Admitting: Sports Medicine

## 2012-08-27 VITALS — BP 125/81 | HR 73 | Wt 233.0 lb

## 2012-08-27 DIAGNOSIS — M109 Gout, unspecified: Secondary | ICD-10-CM

## 2012-08-27 MED ORDER — ALLOPURINOL 300 MG PO TABS: 300.0000 mg | ORAL_TABLET | Freq: Every day | ORAL | Status: AC

## 2012-08-27 NOTE — Assessment & Plan Note (Signed)
Podagra. First metatarsophalangeal joint injection of the right foot as above. Adding allopurinol, he will not start this until the current flare has resolved. He will then followup with his primary care provider to get his uric acid level is close to 5 as possible. I am to provide further interventional treatment when necessary. He should also come back for custom orthotics.

## 2012-08-27 NOTE — Progress Notes (Signed)
  Subjective:    CC: Followup, foot pain  HPI: This is a pleasant 32 year old male, I saw him for low back pain recently, he had a right-sided transforaminal epidural steroid injection at the L5-S1 level back in February, it sounds as though this provided him with a good amount of benefit because he did not follow up with me regarding this afterwards.  Right great toe pain: History of gout, and a uric acid level in the 11's per patient. Has been on indomethacin for podagra, and fortunately this is overall not sufficiently effective. Pain is localized, doesn't radiate, moderate to severe, he does have swelling, no constitutional symptoms.  Past medical history, Surgical history, Family history not pertinant except as noted below, Social history, Allergies, and medications have been entered into the medical record, reviewed, and no changes needed.   Review of Systems: No fevers, chills, night sweats, weight loss, chest pain, or shortness of breath.   Objective:    General: Well Developed, well nourished, and in no acute distress.  Neuro: Alert and oriented x3, extra-ocular muscles intact, sensation grossly intact.  HEENT: Normocephalic, atraumatic, pupils equal round reactive to light, neck supple, no masses, no lymphadenopathy, thyroid nonpalpable.  Skin: Warm and dry, no rashes. Cardiac: Regular rate and rhythm, no murmurs rubs or gallops, no lower extremity edema.  Respiratory: Clear to auscultation bilaterally. Not using accessory muscles, speaking in full sentences. Right foot: There is fusiform swelling over the first metatarsophalangeal joint with a palpable fluid wave.  Procedure: Real-time Ultrasound Guided Injection of right first metatarsophalangeal joint Device: GE Logiq E  Verbal informed consent obtained.  Time-out conducted.  Noted no overlying erythema, induration, or other signs of local infection.  Skin prepped in a sterile fashion.  Local anesthesia: Topical Ethyl  chloride.  With sterile technique and under real time ultrasound guidance:  Copious synovitis seen at the first metatarsophalangeal joint, needle advanced under joint capsule, 0.5 cc Kenalog 40, 1 cc lidocaine injected easily. Completed without difficulty  Pain immediately resolved suggesting accurate placement of the medication.  Advised to call if fevers/chills, erythema, induration, drainage, or persistent bleeding.  Images permanently stored and available for review in the ultrasound unit.  Impression: Technically successful ultrasound guided injection.  Impression and Recommendations:

## 2012-09-01 ENCOUNTER — Encounter: Payer: Self-pay | Admitting: Sports Medicine

## 2012-09-01 ENCOUNTER — Encounter: Payer: Self-pay | Admitting: Family Medicine

## 2012-09-01 ENCOUNTER — Ambulatory Visit (INDEPENDENT_AMBULATORY_CARE_PROVIDER_SITE_OTHER): Payer: BC Managed Care – PPO | Admitting: Family Medicine

## 2012-09-01 ENCOUNTER — Ambulatory Visit (INDEPENDENT_AMBULATORY_CARE_PROVIDER_SITE_OTHER): Payer: BC Managed Care – PPO | Admitting: Sports Medicine

## 2012-09-01 VITALS — BP 132/79 | HR 80 | Wt 224.0 lb

## 2012-09-01 DIAGNOSIS — M109 Gout, unspecified: Secondary | ICD-10-CM

## 2012-09-01 DIAGNOSIS — M722 Plantar fascial fibromatosis: Secondary | ICD-10-CM

## 2012-09-01 MED ORDER — COLCHICINE 0.6 MG PO TABS: ORAL_TABLET | ORAL | Status: AC

## 2012-09-01 NOTE — Progress Notes (Signed)
    Patient was fitted for a : standard, cushioned, semi-rigid orthotic. The orthotic was heated and afterward the patient stood on the orthotic blank positioned on the orthotic stand. The patient was positioned in subtalar neutral position and 10 degrees of ankle dorsiflexion in a weight bearing stance. After completion of molding, a stable base was applied to the orthotic blank. The blank was ground to a stable position for weight bearing. Size: 12 Base: Blue EVA Additional Posting and Padding: None The patient ambulated these, and they were very comfortable.  I spent 40 minutes with this patient, greater than 50% was face-to-face time counseling regarding the below diagnosis.   

## 2012-09-01 NOTE — Assessment & Plan Note (Signed)
Pain resolved after first metatarsophalangeal joint injection.

## 2012-09-01 NOTE — Progress Notes (Signed)
CC: Jason Brandt is a 32 y.o. male is here for update short term disability paperwork   Subjective: HPI:  Followup Gout: Patient reports 75-90% of pain in right great toe improved since interarticular injection with Dr. Karie Schwalbe. he is cutting out high protein diet and trying to increase physical activity. He denies any joint pain today but continues to have moderate low back pain unchanged in in severity or character. He denies joint swelling redness or warmth. He has not started allopurinol yet he does not have a colchicine prescription.  Patient is requesting paperwork to be filled out for short-term disability extending beyond June due to worsening gout symptoms since that time.   Review Of Systems Outlined In HPI  Past Medical History  Diagnosis Date  . Low back pain 02/07/2012  . Plantar fasciitis 03/06/2012     Family History  Problem Relation Age of Onset  . Hypertension Mother   . Diabetes Mother   . Hypertension Father   . Diabetes Father      History  Substance Use Topics  . Smoking status: Former Games developer  . Smokeless tobacco: Not on file  . Alcohol Use: Not on file     Objective: Filed Vitals:   09/01/12 1351  BP: 132/79  Pulse: 80    General: Alert and Oriented, No Acute Distress HEENT: Pupils equal, round, reactive to light. Conjunctivae clear.  Moist mucous membranes pharynx unremarkable Lungs: Clear to auscultation bilaterally, no wheezing/ronchi/rales.  Comfortable work of breathing. Good air movement. Cardiac: Regular rate and rhythm. Normal S1/S2.  No murmurs, rubs, nor gallops.   Extremities: No peripheral edema.  Strong peripheral pulses. There is mild tenderness to palpation of the right MTP without redness swelling or warmth he has full range of motion of this joint  Mental Status: No depression, anxiety, nor agitation. Skin: Warm and dry.  Assessment & Plan: Jason Brandt was seen today for update short term disability paperwork.  Diagnoses and associated  orders for this visit:  Gout - colchicine 0.6 MG tablet; One by mouth daily only for the first first weeks of taking allopurinol.    Gout: I would like him to start allopurinol along with colchicine for 28 days then continue allopurinol and we will titrate based on uric acid at his next visit  25 minutes spent face-to-face during visit today of which at least 50% was counseling or coordinating care regarding gout and completing paperwork for short-term disability.   Return in about 4 weeks (around 09/29/2012).

## 2012-09-01 NOTE — Assessment & Plan Note (Signed)
Custom orthotics as above. 

## 2012-10-20 ENCOUNTER — Encounter: Payer: Self-pay | Admitting: Family Medicine

## 2012-10-20 ENCOUNTER — Ambulatory Visit (INDEPENDENT_AMBULATORY_CARE_PROVIDER_SITE_OTHER): Payer: BC Managed Care – PPO | Admitting: Family Medicine

## 2012-10-20 VITALS — BP 124/71 | HR 70 | Wt 233.0 lb

## 2012-10-20 DIAGNOSIS — M545 Low back pain: Secondary | ICD-10-CM

## 2012-10-20 DIAGNOSIS — R7309 Other abnormal glucose: Secondary | ICD-10-CM

## 2012-10-20 DIAGNOSIS — R739 Hyperglycemia, unspecified: Secondary | ICD-10-CM

## 2012-10-20 DIAGNOSIS — E669 Obesity, unspecified: Secondary | ICD-10-CM

## 2012-10-20 DIAGNOSIS — R5383 Other fatigue: Secondary | ICD-10-CM

## 2012-10-20 DIAGNOSIS — R5381 Other malaise: Secondary | ICD-10-CM

## 2012-10-20 DIAGNOSIS — M109 Gout, unspecified: Secondary | ICD-10-CM

## 2012-10-20 MED ORDER — MELOXICAM 15 MG PO TABS: ORAL_TABLET | ORAL | Status: DC

## 2012-10-20 NOTE — Progress Notes (Signed)
CC: Jason Brandt is a 32 y.o. male is here for Discuss labwork and referral to ortho   Subjective: HPI:  Followup gout: Patient has completed first month allopurinol still taking 300 mg daily no longer Taking colchicine. He denies any peripheral joint pain since I saw him last nor redness swelling or warmth of any joints.   Followup low back pain: Patient reports she has had a slow return of low back pain is localized at the superior portion of the posterior pelvis and in the lower back is nonradiating is described as a burning moderate in severity it is worse with inactivity slightly improved the more mobile he is. Character and severity is identical to that which he experienced prior to a right foraminal nerve block back in February. He's requesting evaluation for this again. He denies saddle paresthesia nor bowel or bladder incontinence nor weakness of extremities  Patient reports fatigue and unintentional weight gain for the past one to 2 months. He is trying to eat better cut out fat and carbohydrates in his diet drastically increased vegetables however has gained 8 pounds unintentionally. He feels fatigued most days of the week all hours of the day. He believes he sleeping well he denies known snoring or apneic episodes, usually feels well rested in the morning. Denies fevers, chills, exertional chest pain, shortness of breath, constipation, diarrhea  Mentions that blood sugars have been as high as 190 after meals however fasting typically below 120   Review Of Systems Outlined In HPI  Past Medical History  Diagnosis Date  . Low back pain 02/07/2012  . Plantar fasciitis 03/06/2012     Family History  Problem Relation Age of Onset  . Hypertension Mother   . Diabetes Mother   . Hypertension Father   . Diabetes Father      History  Substance Use Topics  . Smoking status: Former Games developer  . Smokeless tobacco: Not on file  . Alcohol Use: Not on file     Objective: Filed Vitals:    10/20/12 1432  BP: 124/71  Pulse: 70    General: Alert and Oriented, No Acute Distress HEENT: Pupils equal, round, reactive to light. Conjunctivae clear. Moist mucous membranes pharynx unremarkable Lungs: Clear to auscultation bilaterally, no wheezing/ronchi/rales.  Comfortable work of breathing. Good air movement. Cardiac: Regular rate and rhythm. Normal S1/S2.  No murmurs, rubs, nor gallops.   Abdomen: Obese soft nontender Extremities: No peripheral edema.  Strong peripheral pulses.  Back: Mild reproduction of pain with palpation of L5 spinous process, he has full flexion and extension of the lumbar spine. L5-S1 DTRs two over four bilaterally full range of motion and strength in both lower extremities. Gait normal Mental Status: No depression, anxiety, nor agitation. Skin: Warm and dry.  Assessment & Plan: Kortez was seen today for discuss labwork and referral to ortho.  Diagnoses and associated orders for this visit:  Low back pain - meloxicam (MOBIC) 15 MG tablet; One tab PO qAM with breakfast for 2 weeks, then daily prn pain. - DG Epidurography; Future  Fatigue - Testosterone, free, total - TSH - CBC  Gout - Uric acid  Hyperglycemia - HgB A1c  Obesity - Ambulatory referral to diabetic education    Low back pain: We will refer for repeat epidurography I also encouraged him to restart meloxicam. Fatigue: Looking at low testosterone hypothyroidism and anemia Gout: Due for repeat uric acid goal less than 6 Hyperglycemia: Rule out type 2 diabetes with A1c Obesity: Referral to  nutrition   Return if symptoms worsen or fail to improve.

## 2012-12-03 ENCOUNTER — Other Ambulatory Visit: Payer: Self-pay

## 2013-04-28 ENCOUNTER — Encounter: Payer: Self-pay | Admitting: Family Medicine

## 2013-04-28 ENCOUNTER — Ambulatory Visit (INDEPENDENT_AMBULATORY_CARE_PROVIDER_SITE_OTHER): Payer: BC Managed Care – PPO | Admitting: Family Medicine

## 2013-04-28 ENCOUNTER — Ambulatory Visit (INDEPENDENT_AMBULATORY_CARE_PROVIDER_SITE_OTHER): Payer: BC Managed Care – PPO

## 2013-04-28 VITALS — BP 139/81 | HR 91 | Wt 229.0 lb

## 2013-04-28 DIAGNOSIS — G4733 Obstructive sleep apnea (adult) (pediatric): Secondary | ICD-10-CM | POA: Insufficient documentation

## 2013-04-28 DIAGNOSIS — M25561 Pain in right knee: Secondary | ICD-10-CM

## 2013-04-28 DIAGNOSIS — S81009A Unspecified open wound, unspecified knee, initial encounter: Secondary | ICD-10-CM

## 2013-04-28 DIAGNOSIS — S91009A Unspecified open wound, unspecified ankle, initial encounter: Secondary | ICD-10-CM

## 2013-04-28 DIAGNOSIS — M545 Low back pain, unspecified: Secondary | ICD-10-CM

## 2013-04-28 DIAGNOSIS — X58XXXA Exposure to other specified factors, initial encounter: Secondary | ICD-10-CM

## 2013-04-28 DIAGNOSIS — M5030 Other cervical disc degeneration, unspecified cervical region: Secondary | ICD-10-CM

## 2013-04-28 DIAGNOSIS — M25569 Pain in unspecified knee: Secondary | ICD-10-CM

## 2013-04-28 DIAGNOSIS — S81809A Unspecified open wound, unspecified lower leg, initial encounter: Secondary | ICD-10-CM

## 2013-04-28 MED ORDER — CELECOXIB 200 MG PO CAPS: 200.0000 mg | ORAL_CAPSULE | Freq: Two times a day (BID) | ORAL | Status: DC

## 2013-04-28 NOTE — Progress Notes (Signed)
CC: Jason Brandt is a 33 y.o. male is here for rt knee pain   Subjective: HPI:  Patient presents for knee pain that has been present for one week on a daily basis bursa once a day and is described as a severe stabbing sensation at the medial aspect of the knee which is nonradiating. It only occurs when standing for long periods of time. His job duties require this and it has complicated him from being able to do his job.  He denies redness, swelling, warmth nor catching of the joint. He does report that it locks but does not give way a few times a week for the past months.  Denies any recent trauma or overexertion. He reports a history of shrapnel been found in his right knee on prior x-rays.  He has paperwork to be filled out for a handicap placard in regards to his chronic low back pain.  He has difficulty walking greater than 200 feet without having to stop to rest for his back.  He updates me that he was recently diagnosed with obstructive sleep apnea and is currently using CPAP on a nightly basis. It was beneficial for the first 2 weeks however now does not seem to be as effective for respiratory to sleep. He tells me that he wakes multiple times during the night due to back pain which is relieved most days of the week when taking meloxicam. Denies any new motor or sensory disturbances and lower extremities, sup lesion or bowel or bladder incontinence.  He updates me that x-rays at [facility] shows degeneration of cervical discs months ago but he denies neck pain currently  Review Of Systems Outlined In HPI  Past Medical History  Diagnosis Date  . Low back pain 02/07/2012  . Plantar fasciitis 03/06/2012    No past surgical history on file. Family History  Problem Relation Age of Onset  . Hypertension Mother   . Diabetes Mother   . Hypertension Father   . Diabetes Father     History   Social History  . Marital Status: Single    Spouse Name: N/A    Number of Children: N/A   . Years of Education: N/A   Occupational History  . Not on file.   Social History Main Topics  . Smoking status: Former Games developer  . Smokeless tobacco: Not on file  . Alcohol Use: Not on file  . Drug Use: Not on file  . Sexual Activity: Not on file   Other Topics Concern  . Not on file   Social History Narrative  . No narrative on file     Objective: BP 139/81  Pulse 91  Wt 229 lb (103.874 kg)  General: Alert and Oriented, No Acute Distress HEENT: Pupils equal, round, reactive to light. Conjunctivae clear.  Moist membranes pharynx unremarkable Lungs: Clear to auscultation bilaterally, no wheezing/ronchi/rales.  Comfortable work of breathing. Good air movement. Cardiac: Regular rate and rhythm. Normal S1/S2.  No murmurs, rubs, nor gallops.   Extremities: No peripheral edema.  Strong peripheral pulses. Right knee exam shows full-strength and range of motion. There is no swelling, redness, nor warmth overlying the knee.  No patellar crepitus. No patellar apprehension. No pain with palpation of the inferior patellar pole.  No pain or laxity with valgus nor varus stress. Anterior drawer is negative. McMurray's negative. No popliteal space tenderness or palpable mass. No medial or lateral joint line tenderness to palpation. Mental Status: No depression, anxiety, nor agitation. Skin:  Warm and dry.  Assessment & Plan: Jason Brandt was seen today for rt knee pain.  Diagnoses and associated orders for this visit:  Obstructive sleep apnea  Degeneration of cervical intervertebral disc  Right knee pain - DG Knee Complete 4 Views Right; Future - celecoxib (CELEBREX) 200 MG capsule; Take 1 capsule (200 mg total) by mouth 2 (two) times daily.  Low back pain    Obstructive sleep apnea: Controlled however back pain is interfering with restorative sleep, continue meloxicam if Celebrex doesn't also improve back pain with plan below Low back pain: Time was taken to fill out his handicap placard  form in the presence of the patient Right knee pain: Osteoarthritis versus meniscal tear causing his pain, obtain x-rays given his history of shrapnel to the knee, consider meloxicam try Celebrex twice a day, may return to meloxicam but only take one or the other. One week out of work for some rehabilitation.  40 minutes spent face-to-face during visit today of which at least 50% was counseling or coordinating care regarding: 1. Obstructive sleep apnea   2. Degeneration of cervical intervertebral disc   3. Right knee pain   4. Low back pain      Return in about 3 months (around 07/28/2013).

## 2014-07-12 IMAGING — CR DG LUMBAR SPINE COMPLETE 4+V
5 series · 5 of 5 positions shown · non-contrast
Comparison: None.

CLINICAL DATA: Low back pain for 2 weeks, no injury, some right hip
pain as well

LUMBAR SPINE - COMPLETE 4+ VIEW

[view not recorded (1 of 5)]
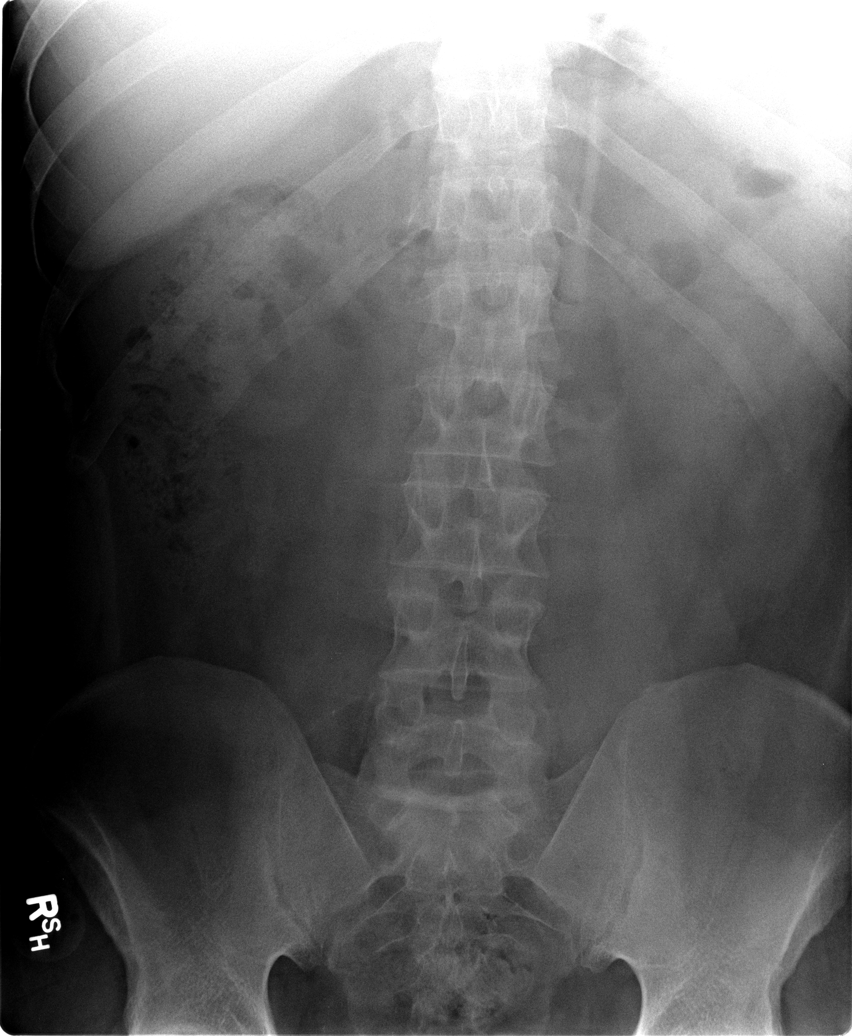

[view not recorded (2 of 5)]
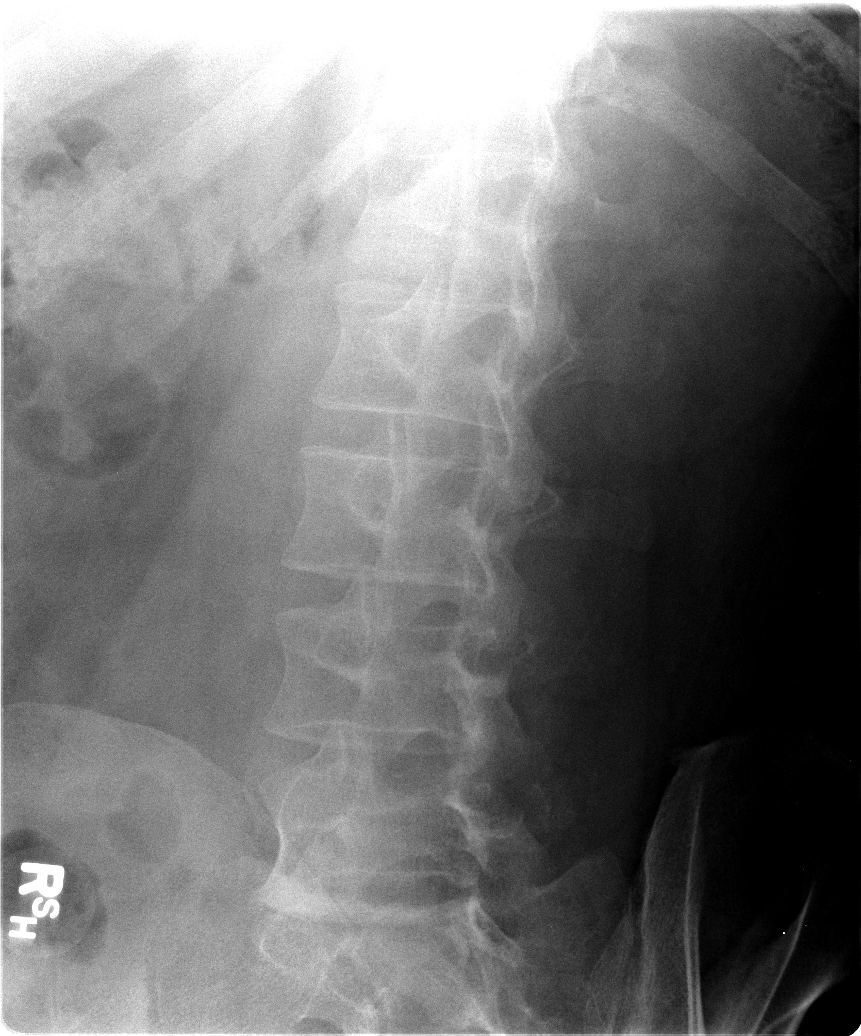

[view not recorded (3 of 5)]
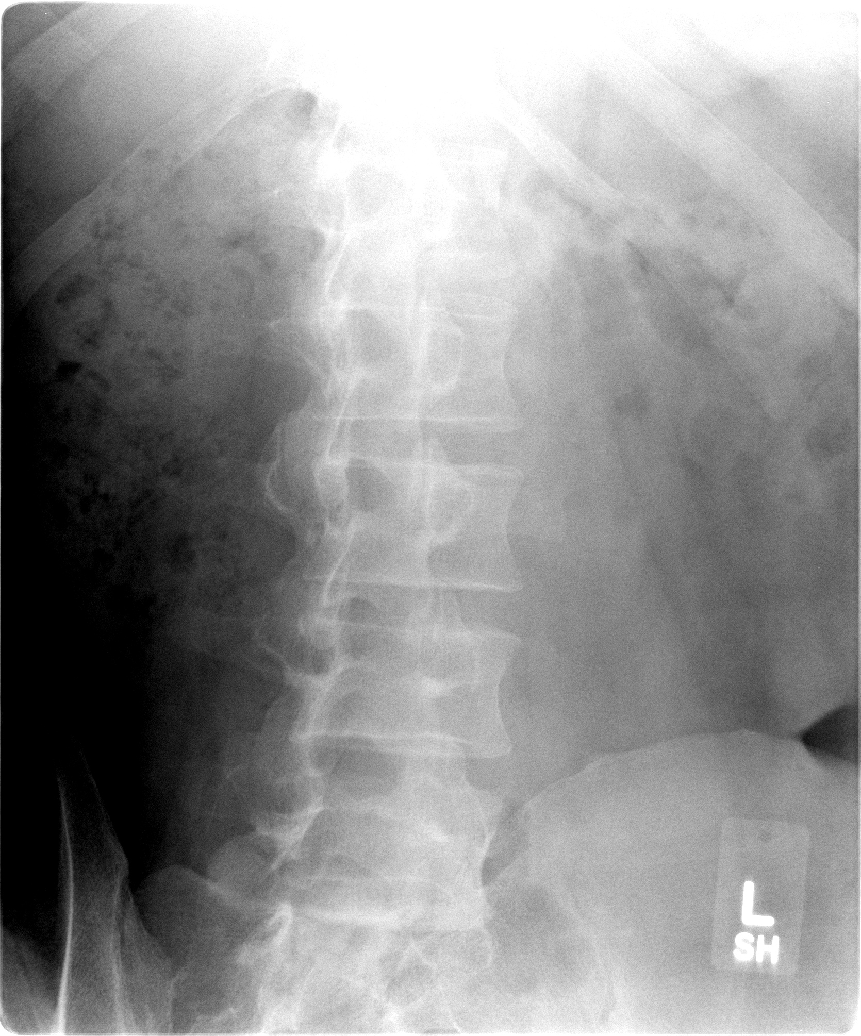

[view not recorded (4 of 5)]
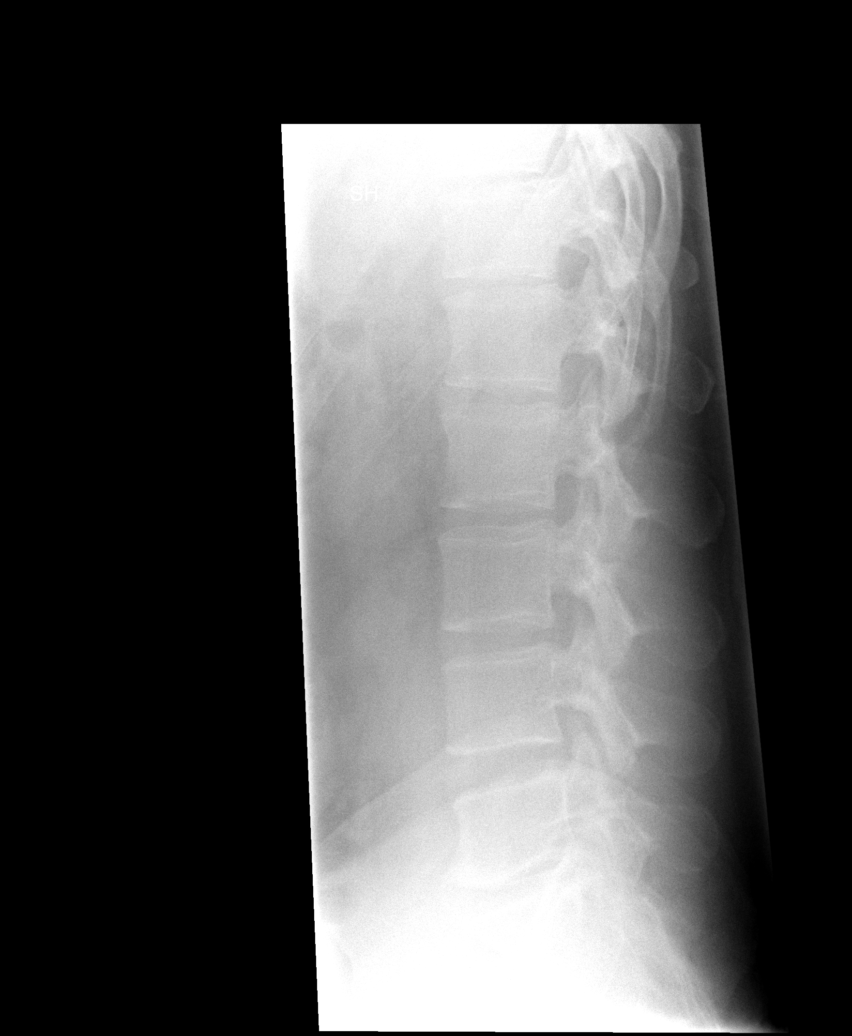

[view not recorded (5 of 5)]
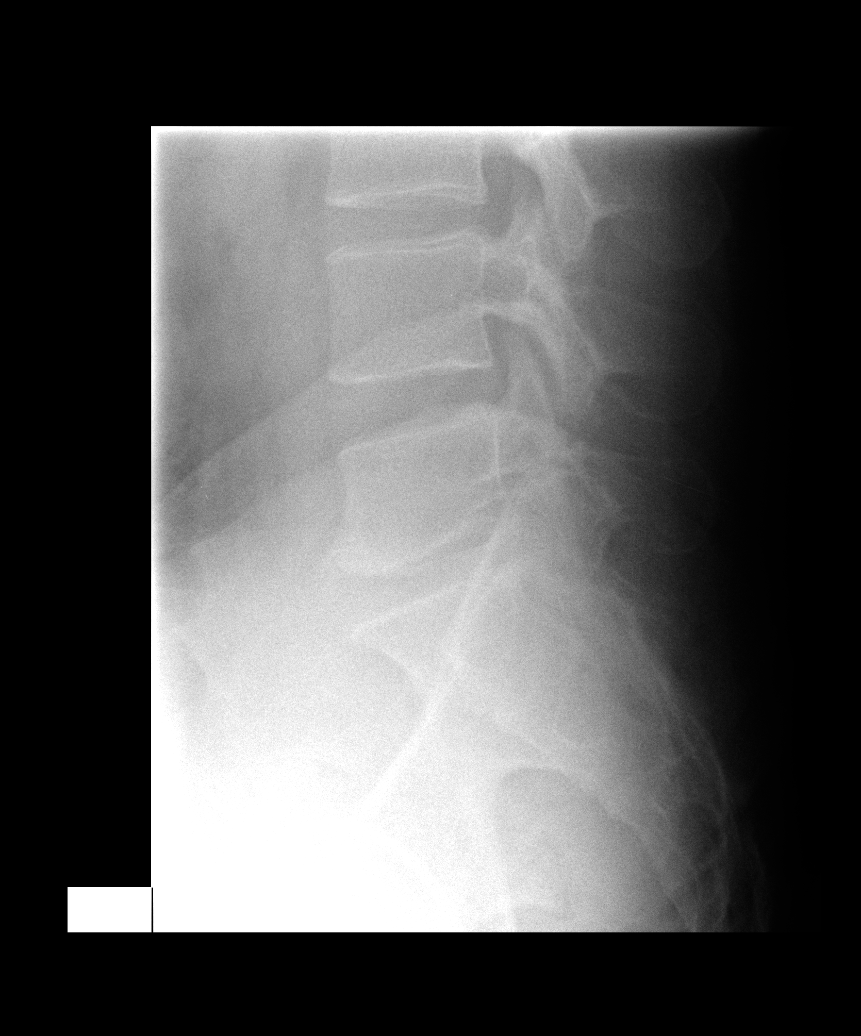

[5 of 5 positions shown; findings below may reference images not displayed]

FINDINGS: The lumbar vertebrae are straightened in alignment.  Only
slight loss of disc space is noted at L5-S1 with mild spur
formation.  No compression deformity is seen.  The SI joints are
corticated.  The bowel gas pattern is not remarkable.
IMPRESSION: Straightened alignment with mild degenerative disc disease at L5-
S1.

## 2015-04-19 ENCOUNTER — Encounter: Payer: Self-pay | Admitting: Emergency Medicine

## 2015-04-19 ENCOUNTER — Emergency Department
Admission: EM | Admit: 2015-04-19 | Discharge: 2015-04-19 | Disposition: A | Discharge status: Home / Self Care | Payer: Self-pay | Source: Home / Self Care | Attending: Family Medicine | Admitting: Family Medicine

## 2015-04-19 DIAGNOSIS — M545 Low back pain, unspecified: Secondary | ICD-10-CM

## 2015-04-19 MED ORDER — PREDNISONE 10 MG PO TABS: ORAL_TABLET | ORAL | Status: DC

## 2015-04-19 NOTE — Discharge Instructions (Signed)
Continue other medications as prescribed.

## 2015-04-19 NOTE — ED Notes (Signed)
Chronic back pain, L4,5 and S1-x 1 week

## 2015-04-27 NOTE — ED Provider Notes (Signed)
CSN: [id number]     Arrival date & time 04/19/15  1529 History   First MD Initiated Contact with Patient 04/19/15 1643     Chief Complaint  Patient presents with  . Back Pain      HPI Comments: Patient has a history of chronic lower back pain.  He complains of non-radiating flare-up of his back pain during the past week.   He denies bowel or bladder dysfunction, and no saddle numbness.  He recalls no recent injury or change in activities to precipitate his pain.    Patient is a 35 y.o. male presenting with back pain. The history is provided by the patient.  Back Pain Location:  Lumbar spine Quality:  Aching Radiates to:  Does not radiate Pain severity:  Moderate Pain is:  Same all the time Onset quality:  Gradual Duration:  1 week Timing:  Constant Progression:  Unchanged Chronicity:  Chronic Context: not falling, not lifting heavy objects, not physical stress, not recent illness and not recent injury   Relieved by:  Nothing Worsened by:  Movement Ineffective treatments:  NSAIDs Associated symptoms: no abdominal pain, no bladder incontinence, no bowel incontinence, no dysuria, no fever, no leg pain, no numbness, no paresthesias, no pelvic pain, no perianal numbness, no tingling and no weakness     Past Medical History  Diagnosis Date  . Low back pain 02/07/2012  . Plantar fasciitis 03/06/2012   History reviewed. No pertinent past surgical history. Family History  Problem Relation Age of Onset  . Hypertension Mother   . Diabetes Mother   . Hypertension Father   . Diabetes Father    Social History  Substance Use Topics  . Smoking status: Former Games developermoker  . Smokeless tobacco: Current User  . Alcohol Use: None    Review of Systems  Constitutional: Negative for fever.  Gastrointestinal: Negative for abdominal pain and bowel incontinence.  Genitourinary: Negative for bladder incontinence, dysuria and pelvic pain.  Musculoskeletal: Positive for back pain.  Neurological:  Negative for tingling, weakness, numbness and paresthesias.  All other systems reviewed and are negative.   Allergies  Amoxicillin and Penicillins  Home Medications   Prior to Admission medications   Medication Sig Start Date End Date Taking? Authorizing Provider  allopurinol (ZYLOPRIM) 300 MG tablet Take 1 tablet (300 mg total) by mouth daily. 08/27/12 08/27/13  Monica Bectonhomas J Thekkekandam, MD  celecoxib (CELEBREX) 200 MG capsule Take 1 capsule (200 mg total) by mouth 2 (two) times daily. 04/28/13   Laren BoomSean Hommel, DO  colchicine 0.6 MG tablet One by mouth daily only for the first first weeks of taking allopurinol. 09/01/12   Sean Hommel, DO  FLUoxetine HCl (PROZAC PO) Take by mouth.    Historical Provider, MD  gabapentin (NEURONTIN) 300 MG capsule Take 1 capsule (300 mg total) by mouth 3 (three) times daily. 05/14/12   Monica Bectonhomas J Thekkekandam, MD  indomethacin (INDOCIN) 50 MG capsule Take twice a day only as needed during gout flare. 08/04/12   Laren BoomSean Hommel, DO  predniSONE (DELTASONE) 10 MG tablet Take PO daily as follows:  5 tabs, then 4 tabs, then 3 tabs, then 2 tabs, then 1 tab 04/19/15   Lattie HawStephen A Beese, MD  Sertraline HCl (ZOLOFT PO) Take by mouth.    Historical Provider, MD   Meds Ordered and Administered this Visit  Medications - No data to display  BP 133/79 mmHg  Pulse 60  Temp(Src) 98.1 F (36.7 C) (Oral)  Ht 6\' 2"  (1.88 m)  Wt 232 lb (105.235 kg)  BMI 29.77 kg/m2  SpO2 99% No data found.   Physical Exam  Constitutional: He is oriented to person, place, and time. He appears well-developed and well-nourished. No distress.  HENT:  Head: Normocephalic.  Mouth/Throat: Oropharynx is clear and moist.  Eyes: Pupils are equal, round, and reactive to light.  Cardiovascular: Normal heart sounds.   Pulmonary/Chest: Breath sounds normal.  Abdominal: There is no tenderness.  Musculoskeletal: He exhibits no edema.       Lumbar back: He exhibits decreased range of motion and tenderness.        Back:  Back:  Decreased forward flexion.  Tenderness over the lower back as noted on diagram.  Straight leg raising test is negative.  Sitting knee extension test is negative.  Strength and sensation in the lower extremities is normal.  Patellar and achilles reflexes are normal   Lymphadenopathy:    He has no cervical adenopathy.  Neurological: He is alert and oriented to person, place, and time.  Skin: Skin is warm and dry. No rash noted.  Nursing note and vitals reviewed.   ED Course  Procedures none  MDM   1. Bilateral low back pain without sciatica     Continue other medications as prescribed. Begin prednisone taper. Followup with Family Doctor if not improved in one week.    Lattie Haw, MD 04/27/15 1019

## 2019-05-04 ENCOUNTER — Ambulatory Visit (INDEPENDENT_AMBULATORY_CARE_PROVIDER_SITE_OTHER): Payer: Self-pay | Admitting: Sports Medicine

## 2019-05-04 ENCOUNTER — Other Ambulatory Visit: Payer: Self-pay

## 2019-05-04 DIAGNOSIS — M47816 Spondylosis without myelopathy or radiculopathy, lumbar region: Secondary | ICD-10-CM

## 2019-05-04 DIAGNOSIS — M722 Plantar fascial fibromatosis: Secondary | ICD-10-CM

## 2019-05-04 NOTE — Assessment & Plan Note (Addendum)
This is a pleasant 40 year old male veteran, I have not seen him in some time, 2014. 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 Symptoms are severe, persistent. My treatment plan would be custom molded orthotics, anti-inflammatories, x-rays. We would likely also do plantar fascia injections under ultrasound guidance. I am happy to proceed with this treatment plan if approved by the [place].

## 2019-05-04 NOTE — Progress Notes (Signed)
    Procedures performed today:    None.  Independent interpretation of notes and tests performed by another provider:   None.  Brief History, Exam, Impression, and Recommendations:    Plantar fasciitis This is a pleasant 39 year old male veteran, I have not seen him in some time, 2014. 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 Symptoms are severe, persistent. My treatment plan would be custom molded orthotics, anti-inflammatories, x-rays. We would likely also do plantar fascia injections under ultrasound guidance. I am happy to proceed with this treatment plan if approved by the [place].  Lumbar spondylosis It sounds like Hendricks also has multilevel degenerative processes in his back, facet arthritis, spinal stenosis. He is currently being managed by [facility].    ___________________________________________ Ihor Austin. Benjamin Stain, M.D., ABFM., CAQSM. Primary Care and Sports Medicine [facility]  Adjunct Instructor of Family Medicine  [employer]

## 2019-05-04 NOTE — Assessment & Plan Note (Signed)
It sounds like Jason Brandt also has multilevel degenerative processes in his back, facet arthritis, spinal stenosis. He is currently being managed by [facility].
